# Patient Record
Sex: Male | Born: 1955 | Race: Black or African American | Hispanic: No | Smoking: Current every day smoker
Health system: Southern US, Community
[De-identification: ages and names within clinical notes are randomized; demographics above are authoritative.]

## PROBLEM LIST (undated history)

## (undated) HISTORY — PX: APPENDECTOMY: SHX54

---

## 2007-12-04 ENCOUNTER — Emergency Department (HOSPITAL_COMMUNITY): Admission: EM | Admit: 2007-12-04 | Discharge: 2007-12-04 | Payer: Self-pay | Admitting: Family Medicine

## 2008-06-26 ENCOUNTER — Emergency Department (HOSPITAL_COMMUNITY): Admission: EM | Admit: 2008-06-26 | Discharge: 2008-06-26 | Payer: Self-pay | Admitting: Emergency Medicine

## 2012-05-17 ENCOUNTER — Emergency Department (HOSPITAL_COMMUNITY): Payer: Self-pay

## 2012-05-17 ENCOUNTER — Emergency Department (HOSPITAL_COMMUNITY)
Admission: EM | Admit: 2012-05-17 | Discharge: 2012-05-17 | Disposition: A | Discharge status: Home / Self Care | Payer: Self-pay | Attending: Emergency Medicine | Admitting: Emergency Medicine

## 2012-05-17 ENCOUNTER — Encounter (HOSPITAL_COMMUNITY): Payer: Self-pay | Admitting: Emergency Medicine

## 2012-05-17 DIAGNOSIS — F17200 Nicotine dependence, unspecified, uncomplicated: Secondary | ICD-10-CM | POA: Insufficient documentation

## 2012-05-17 DIAGNOSIS — R001 Bradycardia, unspecified: Secondary | ICD-10-CM

## 2012-05-17 DIAGNOSIS — R42 Dizziness and giddiness: Secondary | ICD-10-CM | POA: Insufficient documentation

## 2012-05-17 DIAGNOSIS — R61 Generalized hyperhidrosis: Secondary | ICD-10-CM | POA: Insufficient documentation

## 2012-05-17 DIAGNOSIS — I498 Other specified cardiac arrhythmias: Secondary | ICD-10-CM | POA: Insufficient documentation

## 2012-05-17 LAB — CBC WITH DIFFERENTIAL/PLATELET
Eosinophils Absolute: 0.1 10*3/uL (ref 0.0–0.7)
Eosinophils Relative: 2 % (ref 0–5)
HCT: 39.4 % (ref 39.0–52.0)
Hemoglobin: 13.2 g/dL (ref 13.0–17.0)
Lymphocytes Relative: 21 % (ref 12–46)
Lymphs Abs: 1.1 10*3/uL (ref 0.7–4.0)
MCH: 32.5 pg (ref 26.0–34.0)
MCV: 97 fL (ref 78.0–100.0)
Monocytes Absolute: 0.4 10*3/uL (ref 0.1–1.0)
Monocytes Relative: 7 % (ref 3–12)
Platelets: 198 10*3/uL (ref 150–400)
RBC: 4.06 MIL/uL — ABNORMAL LOW (ref 4.22–5.81)
WBC: 5.4 10*3/uL (ref 4.0–10.5)

## 2012-05-17 LAB — BASIC METABOLIC PANEL
BUN: 18 mg/dL (ref 6–23)
CO2: 28 mEq/L (ref 19–32)
Calcium: 9.2 mg/dL (ref 8.4–10.5)
Glucose, Bld: 90 mg/dL (ref 70–99)
Sodium: 140 mEq/L (ref 135–145)

## 2012-05-17 NOTE — ED Provider Notes (Signed)
History     CSN: [id number]  Arrival date & time 05/17/12  1138   First MD Initiated Contact with Patient 05/17/12 1151      Chief Complaint  Patient presents with  . 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 Denies any room spinning or nausea. States he did not feel like he was going to pass out but does admit to feeling lightheaded. He states this has happened to him before with first cigarette of the day, but it was unusual to happen later in the morning. States he felt like he hadn't eaten but he notes he had a normal breakfast today. Denies any headache, blurry vision, CP, SOB, nausea vomiting or fever.   History reviewed. No pertinent past medical history.  History reviewed. No pertinent past surgical history.  No family history on file.  History  Substance Use Topics  . Smoking status: Current Every Day Smoker -- 0.50 packs/day    Types: Cigarettes  . Smokeless tobacco: Not on file  . Alcohol Use: 4.2 oz/week    7 Cans of beer per week      Review of Systems All other systems reviewed and are negative except as noted in HPI.   Allergies  Review of patient's allergies indicates no known allergies.  Home Medications  No current outpatient prescriptions on file.  BP 115/82  Pulse 48  Temp(Src) 97.7 F (36.5 C) (Oral)  Resp 10  SpO2 98%  Physical Exam  Nursing note and vitals reviewed. Constitutional: He is oriented to person, place, and time. He appears well-developed and well-nourished.  HENT:  Head: Normocephalic and atraumatic.  Eyes: EOM are normal. Pupils are equal, round, and reactive to light.  Neck: Normal range of motion. Neck supple.  Cardiovascular: Normal heart sounds and intact distal pulses.   Slow rate  Pulmonary/Chest: Effort normal and breath sounds normal.   Abdominal: Bowel sounds are normal. He exhibits no distension. There is no tenderness.  Musculoskeletal: Normal range of motion. He exhibits no edema and no tenderness.  Neurological: He is alert and oriented to person, place, and time. He has normal strength. No cranial nerve deficit or sensory deficit.  Skin: Skin is warm and dry. No rash noted.  Psychiatric: He has a normal mood and affect.    ED Course  Procedures (including critical care time)  Labs Reviewed  CBC WITH DIFFERENTIAL  BASIC METABOLIC PANEL  TROPONIN I   Dg Chest 2 View  05/17/2012  *RADIOLOGY REPORT*  Clinical Data: Weakness and bradycardia.  History of smoking.  CHEST - 2 VIEW  Comparison: None.  Findings: Normal heart size with clear lung fields.  Moderate hyperinflation suggests COPD.  No effusion or pneumothorax. Negative osseous structures.  IMPRESSION: COPD, no active disease.   Original Report Authenticated By: Davonna Belling, M.D.      No diagnosis found.    MDM   Date: 05/17/2012  Rate: 55  Rhythm: sinus bradycardia  QRS Axis: normal  Intervals: normal  ST/T Wave abnormalities: normal  Conduction Disutrbances:none  Narrative Interpretation:   Old EKG Reviewed: none available  Pt states he feels much better now, but his HR is low. He states he exercises regularly including biking to work daily but does not know what his HR normally is.    2:23 PM Pt still feeling asymptomatic, HR remains in 50s, unclear if this is  related to his dizziness or not. He wants to go home. Advised to follow up with PCP and/or Cards for re-eval if symptoms persist. Or return to the ED for worsening.      Charles B. Bernette Mayers, MD 05/17/12 1424
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
## 2014-03-12 ENCOUNTER — Emergency Department (HOSPITAL_COMMUNITY): Payer: Self-pay

## 2014-03-12 ENCOUNTER — Emergency Department (HOSPITAL_COMMUNITY)
Admission: EM | Admit: 2014-03-12 | Discharge: 2014-03-12 | Disposition: A | Discharge status: Home / Self Care | Payer: Self-pay | Attending: Emergency Medicine | Admitting: Emergency Medicine

## 2014-03-12 ENCOUNTER — Encounter (HOSPITAL_COMMUNITY): Payer: Self-pay | Admitting: *Deleted

## 2014-03-12 DIAGNOSIS — Z720 Tobacco use: Secondary | ICD-10-CM | POA: Insufficient documentation

## 2014-03-12 DIAGNOSIS — Y9355 Activity, bike riding: Secondary | ICD-10-CM | POA: Insufficient documentation

## 2014-03-12 DIAGNOSIS — Y92410 Unspecified street and highway as the place of occurrence of the external cause: Secondary | ICD-10-CM | POA: Insufficient documentation

## 2014-03-12 DIAGNOSIS — S8991XA Unspecified injury of right lower leg, initial encounter: Secondary | ICD-10-CM | POA: Insufficient documentation

## 2014-03-12 DIAGNOSIS — Y998 Other external cause status: Secondary | ICD-10-CM | POA: Insufficient documentation

## 2014-03-12 MED ORDER — TRAMADOL HCL 50 MG PO TABS: 50.0000 mg | ORAL_TABLET | Freq: Four times a day (QID) | ORAL | Status: AC | PRN

## 2014-03-12 NOTE — ED Notes (Signed)
Patient returning form radiology at this time, patient in NAD, a/o x 4
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
## 2014-03-12 NOTE — ED Notes (Signed)
Patient placed on cardiac monitor.
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
